# Patient Record
Sex: Male | Born: 1975 | Race: White | Hispanic: Yes | Marital: Married | Smoking: Never smoker
Health system: Southern US, Community
[De-identification: ages and names within clinical notes are randomized; demographics above are authoritative.]

---

## 2009-05-30 ENCOUNTER — Ambulatory Visit: Payer: Self-pay | Admitting: Family Medicine

## 2011-11-04 IMAGING — CR DG CHEST 2V
1 series · 2 of 2 positions shown · non-contrast
Comparison: none

RESULT:      The lungs are clear. The heart and pulmonary vessels are
normal. The bony and mediastinal structures are unremarkable. There is no
effusion. There is no pneumothorax or evidence of congestive failure.

[Series 1: view not recorded · 0.17mm/px · 2 of 2 slices shown]
[im 1/2]
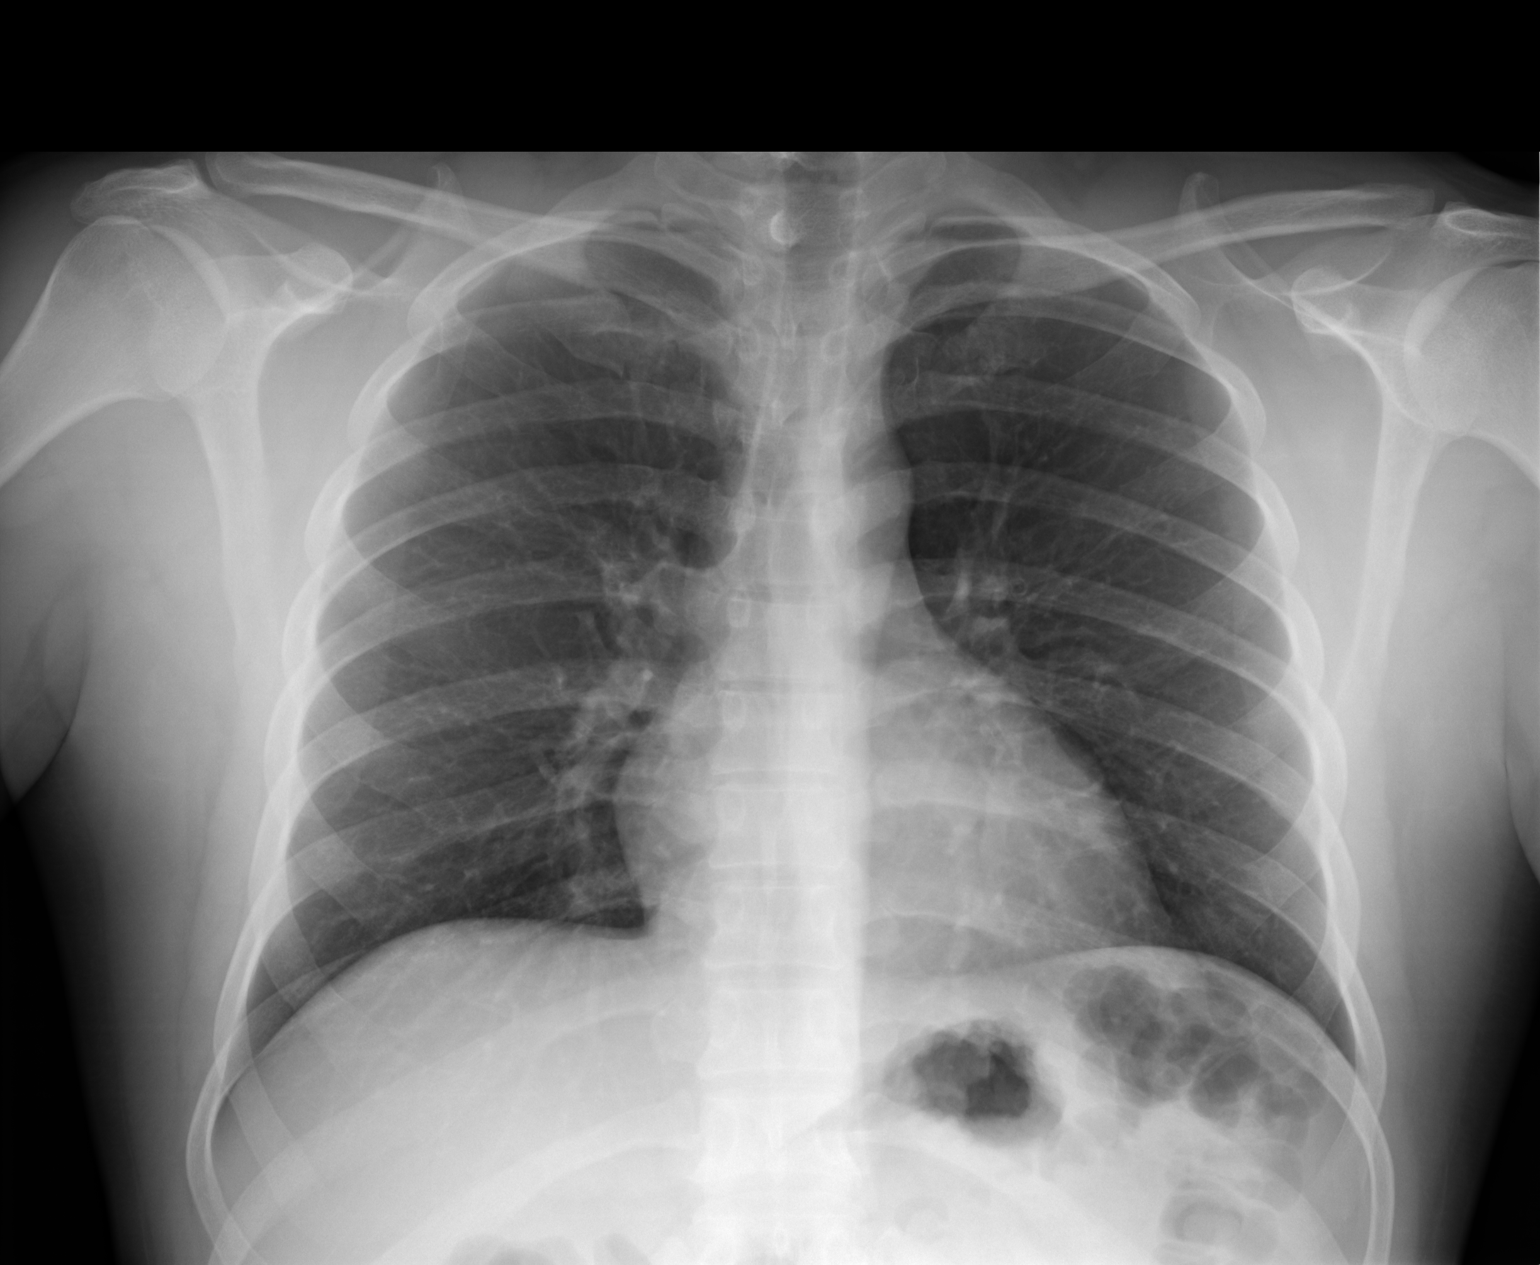
[im 2/2]
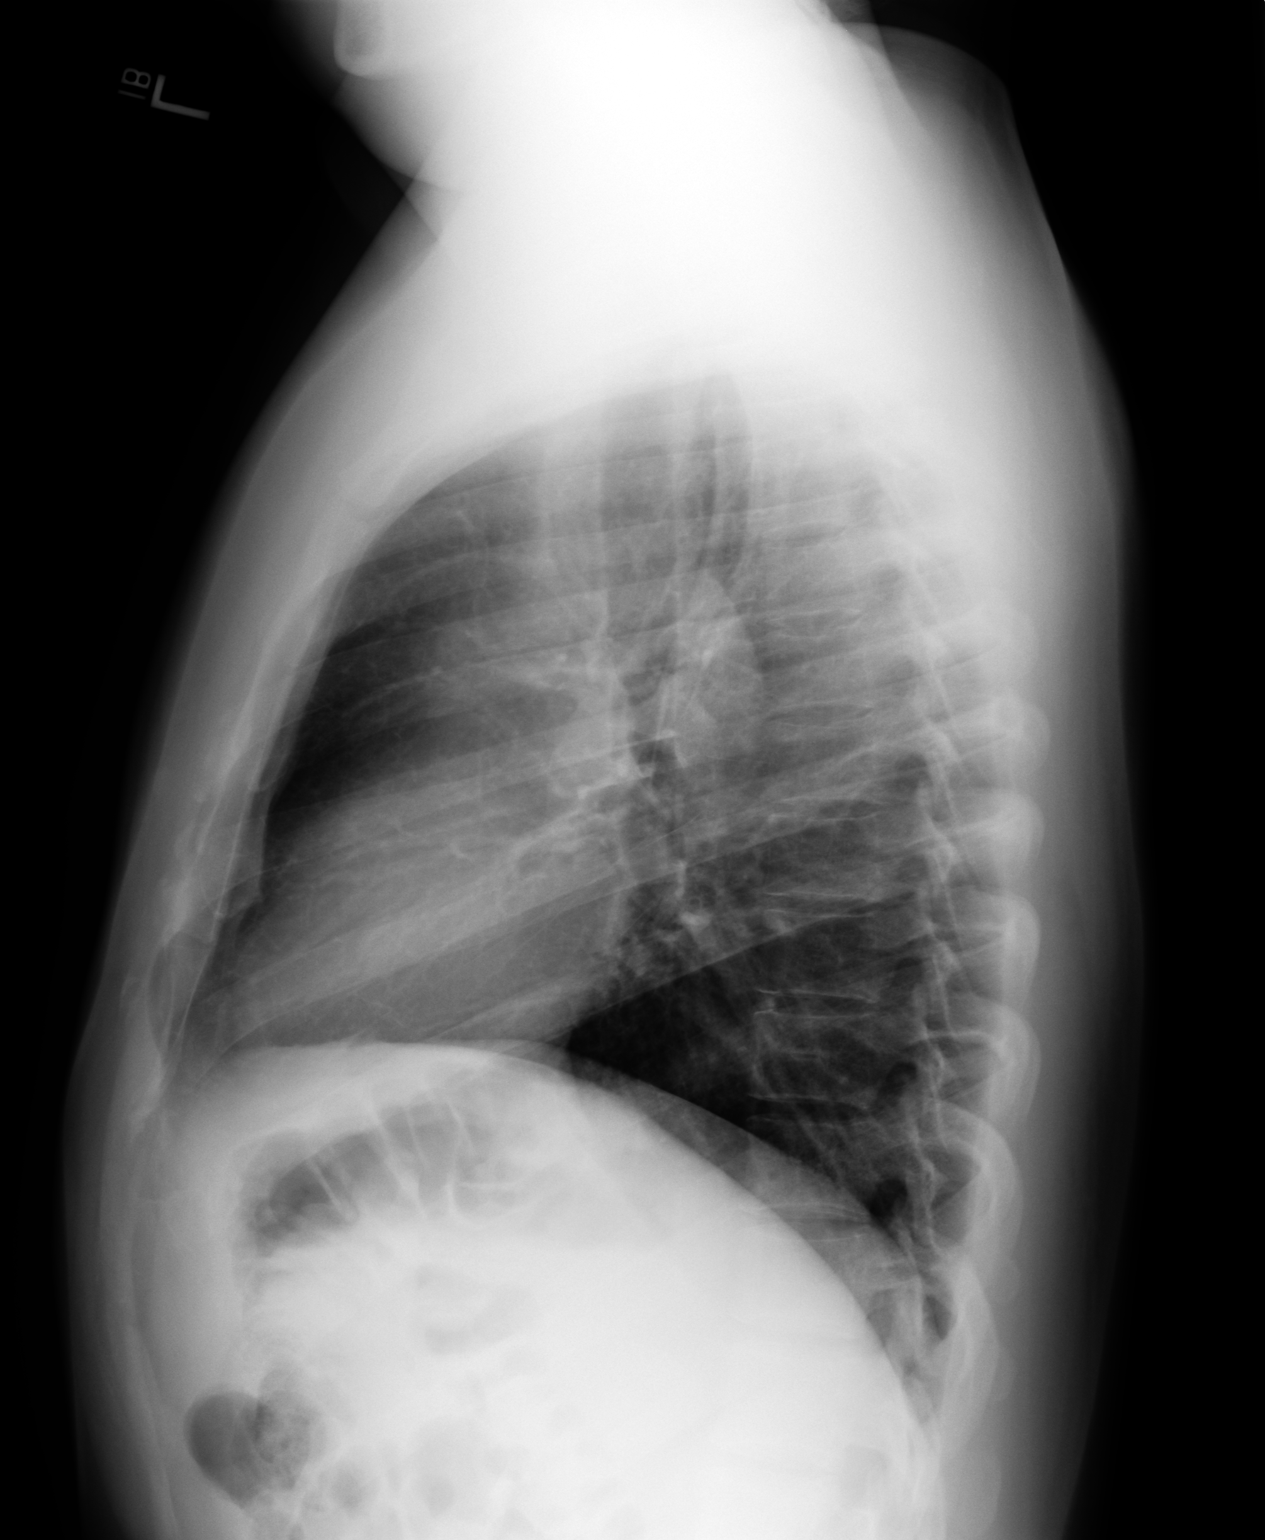

[2 of 2 positions shown; findings below may reference images not displayed]

IMPRESSION: No acute cardiopulmonary disease.

## 2021-08-18 ENCOUNTER — Emergency Department
Admission: EM | Admit: 2021-08-18 | Discharge: 2021-08-18 | Disposition: A | Discharge status: Home / Self Care | Payer: Self-pay | Attending: Emergency Medicine | Admitting: Emergency Medicine

## 2021-08-18 ENCOUNTER — Emergency Department: Payer: Self-pay

## 2021-08-18 ENCOUNTER — Encounter: Payer: Self-pay | Admitting: Emergency Medicine

## 2021-08-18 ENCOUNTER — Other Ambulatory Visit: Payer: Self-pay

## 2021-08-18 DIAGNOSIS — Z23 Encounter for immunization: Secondary | ICD-10-CM | POA: Insufficient documentation

## 2021-08-18 DIAGNOSIS — Z01818 Encounter for other preprocedural examination: Secondary | ICD-10-CM | POA: Insufficient documentation

## 2021-08-18 DIAGNOSIS — I10 Essential (primary) hypertension: Secondary | ICD-10-CM

## 2021-08-18 DIAGNOSIS — S0181XA Laceration without foreign body of other part of head, initial encounter: Secondary | ICD-10-CM

## 2021-08-18 MED ORDER — ACETAMINOPHEN 500 MG PO TABS
1000.0000 mg | ORAL_TABLET | Freq: Once | ORAL | Status: DC
  Filled 2021-08-18: qty 2

## 2021-08-18 MED ORDER — LIDOCAINE-EPINEPHRINE 2 %-1:100000 IJ SOLN
20.0000 mL | Freq: Once | INTRAMUSCULAR | Status: AC
  Administered 2021-08-18: 20 mL via INTRADERMAL
  Filled 2021-08-18: qty 1

## 2021-08-18 MED ORDER — TETANUS-DIPHTH-ACELL PERTUSSIS 5-2.5-18.5 LF-MCG/0.5 IM SUSY
0.5000 mL | PREFILLED_SYRINGE | Freq: Once | INTRAMUSCULAR | Status: AC
  Administered 2021-08-18: 0.5 mL via INTRAMUSCULAR
  Filled 2021-08-18: qty 0.5

## 2021-08-18 MED ORDER — BACITRACIN ZINC 500 UNIT/GM EX OINT
TOPICAL_OINTMENT | CUTANEOUS | Status: AC
  Administered 2021-08-18: 1
  Filled 2021-08-18: qty 0.9

## 2021-08-18 MED ORDER — MUPIROCIN 2 % EX OINT
1.0000 | TOPICAL_OINTMENT | Freq: Once | CUTANEOUS | Status: AC
  Administered 2021-08-18: 1 via TOPICAL
  Filled 2021-08-18: qty 22
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
## 2021-08-18 NOTE — Discharge Instructions (Addendum)
Have your blood pressure rechecked at your PCP follow-up visit.

## 2022-08-04 ENCOUNTER — Emergency Department: Payer: 59

## 2022-08-04 ENCOUNTER — Encounter: Payer: Self-pay | Admitting: Emergency Medicine

## 2022-08-04 ENCOUNTER — Emergency Department
Admission: EM | Admit: 2022-08-04 | Discharge: 2022-08-04 | Disposition: A | Discharge status: Home / Self Care | Payer: 59 | Attending: Emergency Medicine | Admitting: Emergency Medicine

## 2022-08-04 DIAGNOSIS — S0993XA Unspecified injury of face, initial encounter: Secondary | ICD-10-CM | POA: Diagnosis present

## 2022-08-04 DIAGNOSIS — S0232XA Fracture of orbital floor, left side, initial encounter for closed fracture: Secondary | ICD-10-CM | POA: Diagnosis not present

## 2022-08-04 DIAGNOSIS — S0121XA Laceration without foreign body of nose, initial encounter: Secondary | ICD-10-CM | POA: Insufficient documentation

## 2022-08-04 DIAGNOSIS — I10 Essential (primary) hypertension: Secondary | ICD-10-CM | POA: Diagnosis not present

## 2022-08-04 DIAGNOSIS — R609 Edema, unspecified: Secondary | ICD-10-CM | POA: Diagnosis not present

## 2022-08-04 DIAGNOSIS — S0990XA Unspecified injury of head, initial encounter: Secondary | ICD-10-CM | POA: Insufficient documentation

## 2022-08-04 DIAGNOSIS — S01112A Laceration without foreign body of left eyelid and periocular area, initial encounter: Secondary | ICD-10-CM | POA: Diagnosis not present

## 2022-08-04 DIAGNOSIS — R000 Tachycardia, unspecified: Secondary | ICD-10-CM | POA: Diagnosis not present

## 2022-08-04 DIAGNOSIS — S0285XA Fracture of orbit, unspecified, initial encounter for closed fracture: Secondary | ICD-10-CM

## 2022-08-04 MED ORDER — CEPHALEXIN 500 MG PO CAPS: 500.0000 mg | ORAL_CAPSULE | Freq: Two times a day (BID) | ORAL | 0 refills | Status: AC

## 2022-08-04 MED ORDER — LIDOCAINE-EPINEPHRINE (PF) 2 %-1:200000 IJ SOLN
10.0000 mL | Freq: Once | INTRAMUSCULAR | Status: AC
  Administered 2022-08-04: 10 mL
  Filled 2022-08-04: qty 10
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
## 2022-08-04 NOTE — ED Notes (Signed)
Patient Alert and oriented to baseline. Stable and ambulatory to baseline. Patient verbalized understanding of the discharge instructions.  Patient belongings were taken by the patient.   

## 2022-08-04 NOTE — ED Provider Notes (Signed)
..  Laceration Repair  Date/Time: 08/04/2022 7:49 AM  Performed by: Trinna Post, MD Authorized by: Trinna Post, MD   Consent:    Consent obtained:  Verbal   Consent given by:  Patient   Risks, benefits, and alternatives were discussed: yes   Anesthesia:    Anesthesia method:  Local infiltration Laceration details:    Location:  Face   Face location:  L eyebrow   Length (cm):  2 Treatment:    Area cleansed with:  Saline   Visualized foreign bodies/material removed: no   Skin repair:    Repair method:  Sutures   Suture size:  4-0   Wound skin closure material used: monocryl.   Suture technique:  Simple interrupted   Number of sutures:  3 Repair type:    Repair type:  Simple Post-procedure details:    Dressing:  Open (no dressing)   Procedure completion:  Tolerated .Marland KitchenLaceration Repair  Date/Time: 08/04/2022 7:50 AM  Performed by: Trinna Post, MD Authorized by: Trinna Post, MD   Consent:    Consent obtained:  Verbal   Consent given by:  Patient   Risks, benefits, and alternatives were discussed: yes   Anesthesia:    Anesthesia method:  Local infiltration   Local anesthetic:  Lidocaine 2% WITH epi Laceration details:    Location:  Face   Face location:  Nose   Length (cm):  1 Treatment:    Area cleansed with:  Saline Skin repair:    Repair method:  Sutures   Suture size:  4-0   Wound skin closure material used: monocryl.   Number of sutures:  3 Repair type:    Repair type:  Simple Post-procedure details:    Dressing:  Open (no dressing)   Procedure completion:  Berenda Morale, MD 08/04/22 [phone]

## 2022-08-04 NOTE — Discharge Instructions (Addendum)
Control de dolor: Ibuprofeno (motrin/aleve/advil): puede tomar de 3 a 4 tabletas (600 a 800 mg) cada 6 horas segn sea necesario para el dolor o la fiebre. Acetaminofn (tylenol): puede tomar 2 tabletas extrafuertes (1000 mg) cada 6 horas segn sea necesario para el dolor o la fiebre. Puede alternar estos medicamentos o tomarlos juntos. Asegrese de comer/beber agua mientras toma estos medicamentos. 
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
